# Patient Record
Sex: Female | Born: 1970 | Hispanic: Yes | Marital: Married | Smoking: Never smoker
Health system: Southern US, Community
[De-identification: ages and names within clinical notes are randomized; demographics above are authoritative.]

## PROBLEM LIST (undated history)

## (undated) HISTORY — PX: TUBAL LIGATION: SHX77

---

## 2017-03-13 ENCOUNTER — Ambulatory Visit
Admission: EM | Admit: 2017-03-13 | Discharge: 2017-03-13 | Disposition: A | Discharge status: Home / Self Care | Payer: Self-pay | Attending: Family Medicine | Admitting: Family Medicine

## 2017-03-13 ENCOUNTER — Encounter: Payer: Self-pay | Admitting: Emergency Medicine

## 2017-03-13 DIAGNOSIS — J111 Influenza due to unidentified influenza virus with other respiratory manifestations: Secondary | ICD-10-CM

## 2017-03-13 DIAGNOSIS — R69 Illness, unspecified: Secondary | ICD-10-CM

## 2017-03-13 DIAGNOSIS — R0981 Nasal congestion: Secondary | ICD-10-CM

## 2017-03-13 DIAGNOSIS — R5383 Other fatigue: Secondary | ICD-10-CM

## 2017-03-13 DIAGNOSIS — R05 Cough: Secondary | ICD-10-CM

## 2017-03-13 MED ORDER — OSELTAMIVIR PHOSPHATE 75 MG PO CAPS: 75.0000 mg | ORAL_CAPSULE | Freq: Two times a day (BID) | ORAL | 0 refills | Status: DC

## 2017-03-13 NOTE — ED Provider Notes (Signed)
[facility]    CSN: [id number] Arrival date & time: 03/13/17  0847     History   Chief Complaint Chief Complaint  Patient presents with  . Influenza    HPI Kristin David is a 46 y.o. female.   The history is provided by the patient.  Influenza  Presenting symptoms: cough, fatigue, fever, myalgias and rhinorrhea   Associated symptoms: nasal congestion   URI  Presenting symptoms: congestion, cough, fatigue, fever and rhinorrhea   Severity:  Moderate Onset quality:  Sudden Duration:  3 days Timing:  Constant Progression:  Worsening Chronicity:  New Relieved by:  None tried Ineffective treatments:  None tried Associated symptoms: myalgias   Associated symptoms: no sinus pain and no wheezing   Risk factors: sick contacts   Risk factors: not elderly, no chronic cardiac disease, no chronic kidney disease, no chronic respiratory disease, no diabetes mellitus, no immunosuppression, no recent illness and no recent travel     History reviewed. No pertinent past medical history.  There are no active problems to display for this patient.   Past Surgical History:  Procedure Laterality Date  . TUBAL LIGATION      OB History    No data available       Home Medications    Prior to Admission medications   Medication Sig Start Date End Date Taking? Authorizing Provider  oseltamivir (TAMIFLU) 75 MG capsule Take 1 capsule (75 mg total) by mouth 2 (two) times daily. 03/13/17   Payton Mccallum, MD    Family History Family History  Problem Relation Age of Onset  . Diabetes Mother   . Heart attack Mother   . Heart attack Father     Social History Social History  Substance Use Topics  . Smoking status: Never Smoker  . Smokeless tobacco: Never Used  . Alcohol use No     Allergies   Patient has no known allergies.   Review of Systems Review of Systems  Constitutional: Positive for fatigue and fever.  HENT: Positive for congestion and  rhinorrhea. Negative for sinus pain.   Respiratory: Positive for cough. Negative for wheezing.   Musculoskeletal: Positive for myalgias.     Physical Exam Triage Vital Signs ED Triage Vitals  Enc Vitals Group     BP 03/13/17 0902 111/69     Pulse Rate 03/13/17 0902 81     Resp 03/13/17 0902 18     Temp 03/13/17 0902 98.1 F (36.7 C)     Temp Source 03/13/17 0902 Oral     SpO2 03/13/17 0902 98 %     Weight 03/13/17 0859 166 lb (75.3 kg)     Height 03/13/17 0859  (1.6 m)     Head Circumference --      Peak Flow --      Pain Score 03/13/17 0859 7     Pain Loc --      Pain Edu? --      Excl. in GC? --    No data found.   Updated Vital Signs BP 111/69 (BP Location: Left Arm)   Pulse 81   Temp 98.1 F (36.7 C) (Oral)   Resp 18   Ht  (1.6 m)   Wt 166 lb (75.3 kg)   LMP 03/13/2017   SpO2 98%   BMI 29.41 kg/m   Visual Acuity Right Eye Distance:   Left Eye Distance:   Bilateral Distance:    Right Eye Near:  Left Eye Near:    Bilateral Near:     Physical Exam  Constitutional: She appears well-developed and well-nourished. No distress.  HENT:  Head: Normocephalic and atraumatic.  Right Ear: Tympanic membrane, external ear and ear canal normal.  Left Ear: Tympanic membrane, external ear and ear canal normal.  Nose: No mucosal edema, rhinorrhea, nose lacerations, sinus tenderness, nasal deformity, septal deviation or nasal septal hematoma. No epistaxis.  No foreign bodies. Right sinus exhibits no maxillary sinus tenderness and no frontal sinus tenderness. Left sinus exhibits no maxillary sinus tenderness and no frontal sinus tenderness.  Mouth/Throat: Uvula is midline, oropharynx is clear and moist and mucous membranes are normal. No oropharyngeal exudate.  Eyes: Conjunctivae and EOM are normal. Pupils are equal, round, and reactive to light. Right eye exhibits no discharge. Left eye exhibits no discharge. No scleral icterus.  Neck: Normal range of motion.  Neck supple. No thyromegaly present.  Cardiovascular: Normal rate, regular rhythm and normal heart sounds.   Pulmonary/Chest: Effort normal and breath sounds normal. No respiratory distress. She has no wheezes. She has no rales.  Lymphadenopathy:    She has no cervical adenopathy.  Skin: She is not diaphoretic.  Nursing note and vitals reviewed.    UC Treatments / Results  Labs (all labs ordered are listed, but only abnormal results are displayed) Labs Reviewed - No data to display  EKG  EKG Interpretation None       Radiology No results found.  Procedures Procedures (including critical care time)  Medications Ordered in UC Medications - No data to display   Initial Impression / Assessment and Plan / UC Course  I have reviewed the triage vital signs and the nursing notes.  Pertinent labs & imaging results that were available during my care of the patient were reviewed by me and considered in my medical decision making (see chart for details).      Final Clinical Impressions(s) / UC Diagnoses   Final diagnoses:  Influenza-like illness    New Prescriptions Discharge Medication List as of 03/13/2017  9:27 AM    START taking these medications   Details  oseltamivir (TAMIFLU) 75 MG capsule Take 1 capsule (75 mg total) by mouth 2 (two) times daily., Starting Sun 03/13/2017, Normal       1. diagnosis reviewed with patient 2. rx as per orders above; reviewed possible side effects, interactions, risks and benefits  3. Recommend supportive treatment with rest, fluids 4. Follow-up prn if symptoms worsen or don't improve   Payton Mccallum, MD 03/13/17 1018

## 2017-03-13 NOTE — ED Triage Notes (Signed)
Patient states she developed cough body aches and fever on Thursday evening.

## 2017-03-18 ENCOUNTER — Ambulatory Visit
Admission: EM | Admit: 2017-03-18 | Discharge: 2017-03-18 | Disposition: A | Discharge status: Home / Self Care | Payer: BLUE CROSS/BLUE SHIELD | Attending: Emergency Medicine | Admitting: Emergency Medicine

## 2017-03-18 ENCOUNTER — Encounter: Payer: Self-pay | Admitting: Emergency Medicine

## 2017-03-18 DIAGNOSIS — R05 Cough: Secondary | ICD-10-CM

## 2017-03-18 DIAGNOSIS — J069 Acute upper respiratory infection, unspecified: Secondary | ICD-10-CM

## 2017-03-18 MED ORDER — BENZONATATE 200 MG PO CAPS: ORAL_CAPSULE | ORAL | 0 refills | Status: DC

## 2017-03-18 MED ORDER — GUAIFENESIN-CODEINE 100-10 MG/5ML PO SYRP: 5.0000 mL | ORAL_SOLUTION | Freq: Three times a day (TID) | ORAL | 0 refills | Status: DC | PRN

## 2017-03-18 MED ORDER — FLUTICASONE PROPIONATE 50 MCG/ACT NA SUSP: 2.0000 | Freq: Every day | NASAL | 0 refills | Status: AC

## 2017-03-18 NOTE — ED Provider Notes (Signed)
CSN: [id number]     Arrival date & time 03/18/17  1425 History   None    Chief Complaint  Patient presents with  . Fever  . Cough   (Consider location/radiation/quality/duration/timing/severity/associated sxs/prior Treatment) HPI  46 year old female who was seen 5 days ago here at [facility] urgent care. With a flu like illness. States that she finished her course of Tamiflu but does not feel like she is improved and has continually coughed since the onset. Addition she's having discharge from her nose. She is not work last week because of her illness. Is not any fever or chills. The cough is productive of yellow phlegm.        History reviewed. No pertinent past medical history. Past Surgical History:  Procedure Laterality Date  . TUBAL LIGATION     Family History  Problem Relation Age of Onset  . Diabetes Mother   . Heart attack Mother   . Heart attack Father    Social History  Substance Use Topics  . Smoking status: Never Smoker  . Smokeless tobacco: Never Used  . Alcohol use No   OB History    No data available     Review of Systems  Constitutional: Positive for activity change and chills. Negative for fatigue and fever.  HENT: Positive for congestion, postnasal drip, rhinorrhea, sinus pain and sinus pressure.   Respiratory: Positive for cough. Negative for shortness of breath, wheezing and stridor.   All other systems reviewed and are negative.   Allergies  Patient has no known allergies.  Home Medications   Prior to Admission medications   Medication Sig Start Date End Date Taking? Authorizing Provider  benzonatate (TESSALON) 200 MG capsule Take one cap TID PRN cough 03/18/17   Lutricia Feil, PA-C  fluticasone [facility]) 50 MCG/ACT nasal spray Place 2 sprays into both nostrils daily. 03/18/17   Lutricia Feil, PA-C  guaiFENesin-codeine (CHERATUSSIN AC) 100-10 MG/5ML syrup Take 5 mLs by mouth 3 (three) times daily as needed for cough. 03/18/17   Lutricia Feil, PA-C   Meds Ordered and Administered this Visit  Medications - No data to display  BP 125/83 (BP Location: Left Arm)   Pulse (!) 53   Temp 98.2 F (36.8 C) (Oral)   Resp 16   Ht  (1.6 m)   Wt 166 lb (75.3 kg)   LMP 03/13/2017   SpO2 100%   BMI 29.41 kg/m  No data found.   Physical Exam  Constitutional: She is oriented to person, place, and time. She appears well-developed and well-nourished. No distress.  HENT:  Head: Normocephalic and atraumatic.  Right Ear: External ear normal.  Left Ear: External ear normal.  Nose: Nose normal.  Mouth/Throat: Oropharynx is clear and moist. No oropharyngeal exudate.  Eyes: Pupils are equal, round, and reactive to light. Right eye exhibits no discharge. Left eye exhibits no discharge.  Neck: Normal range of motion.  Pulmonary/Chest: Effort normal and breath sounds normal.  Musculoskeletal: Normal range of motion.  Lymphadenopathy:    She has no cervical adenopathy.  Neurological: She is alert and oriented to person, place, and time.  Skin: Skin is warm and dry. She is not diaphoretic.  Psychiatric: She has a normal mood and affect. Her behavior is normal. Judgment and thought content normal.  Nursing note and vitals reviewed.   Urgent Care Course     Procedures (including critical care time)  Labs Review Labs Reviewed - No data to display  Imaging  Review No results found.   Visual Acuity Review  Right Eye Distance:   Left Eye Distance:   Bilateral Distance:    Right Eye Near:   Left Eye Near:    Bilateral Near:         MDM   1. Upper respiratory tract infection, unspecified type    Discharge Medication List as of 03/18/2017  3:34 PM    START taking these medications   Details  benzonatate (TESSALON) 200 MG capsule Take one cap TID PRN cough, Normal    fluticasone (FLONASE) 50 MCG/ACT nasal spray Place 2 sprays into both nostrils daily., Starting Fri 03/18/2017, Normal    guaiFENesin-codeine  (CHERATUSSIN AC) 100-10 MG/5ML syrup Take 5 mLs by mouth 3 (three) times daily as needed for cough., Starting Fri 03/18/2017, Print      Plan: 1. Test/x-ray results and diagnosis reviewed with patient 2. rx as per orders; risks, benefits, potential side effects reviewed with patient 3. Recommend supportive treatment with Fluids and rest. Tylenol or Motrin for body aches or fever. If she is not improving she should follow-up with her primary care physician 4. F/u prn if symptoms worsen or don't improve     Lutricia Feil, PA-C 03/18/17 1541

## 2017-03-18 NOTE — ED Triage Notes (Signed)
Patient c/o ongoing cough, congestion and fever since Sunday.

## 2017-09-19 ENCOUNTER — Telehealth: Payer: Self-pay

## 2017-09-19 ENCOUNTER — Encounter: Payer: Self-pay | Admitting: *Deleted

## 2017-09-19 ENCOUNTER — Ambulatory Visit
Admission: EM | Admit: 2017-09-19 | Discharge: 2017-09-19 | Disposition: A | Discharge status: Home / Self Care | Payer: BLUE CROSS/BLUE SHIELD | Attending: Family Medicine | Admitting: Family Medicine

## 2017-09-19 DIAGNOSIS — R110 Nausea: Secondary | ICD-10-CM

## 2017-09-19 DIAGNOSIS — R51 Headache: Secondary | ICD-10-CM

## 2017-09-19 DIAGNOSIS — R0981 Nasal congestion: Secondary | ICD-10-CM

## 2017-09-19 DIAGNOSIS — B349 Viral infection, unspecified: Secondary | ICD-10-CM | POA: Diagnosis not present

## 2017-09-19 MED ORDER — ONDANSETRON HCL 4 MG PO TABS: 4.0000 mg | ORAL_TABLET | Freq: Three times a day (TID) | ORAL | 0 refills | Status: DC | PRN

## 2017-09-19 NOTE — ED Provider Notes (Signed)
[facility]    CSN: [id number] Arrival date & time: 09/19/17  1115  History   Chief Complaint Chief Complaint  Patient presents with  . Nausea  . Generalized Body Aches  . Headache  . Nasal Congestion   HPI  46 year old female presents with the above complaints. Patient states she's been sick for the past 4 days. She had a coworker with similar symptoms. She's had body aches, headaches, nausea. She had some sneezing & runny nose this morning. No documented fever. No other associated symptoms. No medications or interventions tried. No known exacerbating or relieving factors. No other associated symptoms.  History reviewed. No pertinent past medical history.  Past Surgical History:  Procedure Laterality Date  . TUBAL LIGATION      OB History    No data available     Home Medications    Prior to Admission medications   Medication Sig Start Date End Date Taking? Authorizing Provider  benzonatate (TESSALON) 200 MG capsule Take one cap TID PRN cough 03/18/17   Ovid Curd P, PA-C  fluticasone [facility]) 50 MCG/ACT nasal spray Place 2 sprays into both nostrils daily. 03/18/17   Lutricia Feil, PA-C  guaiFENesin-codeine (CHERATUSSIN AC) 100-10 MG/5ML syrup Take 5 mLs by mouth 3 (three) times daily as needed for cough. 03/18/17   Lutricia Feil, PA-C  ondansetron (ZOFRAN) 4 MG tablet Take 1 tablet (4 mg total) by mouth every 8 (eight) hours as needed for nausea or vomiting. 09/19/17   Tommie Sams, DO    Family History Family History  Problem Relation Age of Onset  . Diabetes Mother   . Heart attack Mother   . Heart attack Father     Social History Social History  Substance Use Topics  . Smoking status: Never Smoker  . Smokeless tobacco: Never Used  . Alcohol use No     Allergies   Patient has no known allergies.   Review of Systems Review of Systems  Constitutional: Negative for fever.  HENT: Positive for rhinorrhea and sneezing.     Gastrointestinal: Positive for nausea.  Musculoskeletal:       Body aches.   Neurological: Positive for headaches.   Physical Exam Triage Vital Signs ED Triage Vitals  Enc Vitals Group     BP 09/19/17 1147 139/76     Pulse Rate 09/19/17 1147 (!) 52     Resp 09/19/17 1147 16     Temp 09/19/17 1147 98.1 F (36.7 C)     Temp Source 09/19/17 1147 Oral     SpO2 09/19/17 1147 100 %     Weight 09/19/17 1149 175 lb (79.4 kg)     Height 09/19/17 1149 5' 2.5" (1.588 m)     Head Circumference --      Peak Flow --      Pain Score 09/19/17 1150 0     Pain Loc --      Pain Edu? --      Excl. in GC? --    Updated Vital Signs BP 139/76 (BP Location: Left Arm)   Pulse (!) 52   Temp 98.1 F (36.7 C) (Oral)   Resp 16   Ht 5' 2.5" (1.588 m)   Wt 175 lb (79.4 kg)   LMP 09/11/2017   SpO2 100%   BMI 31.50 kg/m   Physical Exam  Constitutional: She appears well-developed. No distress.  HENT:  Head: Normocephalic and atraumatic.  Mouth/Throat: Oropharynx is clear and moist.  Normal TM's  bilaterally.   Eyes: Conjunctivae are normal.  Cardiovascular: Normal rate and regular rhythm.   Murmur heard. Pulmonary/Chest: Effort normal and breath sounds normal. She has no wheezes. She has no rales.  Abdominal: Soft. She exhibits no distension. There is no tenderness.  Neurological: She is alert.  Vitals reviewed.  UC Treatments / Results  Labs (all labs ordered are listed, but only abnormal results are displayed) Labs Reviewed - No data to display  EKG  EKG Interpretation None       Radiology No results found.  Procedures Procedures (including critical care time)  Medications Ordered in UC Medications - No data to display   Initial Impression / Assessment and Plan / UC Course  I have reviewed the triage vital signs and the nursing notes.  Pertinent labs & imaging results that were available during my care of the patient were reviewed by me and considered in my medical  decision making (see chart for details).    46 year old female presents with a constellation of symptoms consistent with a viral illness. Exam unremarkable. Supportive care. Zofran as needed. Final Clinical Impressions(s) / UC Diagnoses   Final diagnoses:  Viral illness    New Prescriptions New Prescriptions   ONDANSETRON (ZOFRAN) 4 MG TABLET    Take 1 tablet (4 mg total) by mouth every 8 (eight) hours as needed for nausea or vomiting.     Controlled Substance Prescriptions  Controlled Substance Registry consulted? Not Applicable   Tommie Sams, DO 09/19/17 1226

## 2017-09-19 NOTE — ED Triage Notes (Signed)
Patient started having symptoms of body aches, headache, nasal congestion, and nausea 4 days ago. Patient reports co worker with same symptoms.

## 2017-09-19 NOTE — Discharge Instructions (Signed)
This is viral.  Zofran as needed for nausea. OTC medication as needed.  Take care  Dr. Adriana Simas

## 2020-12-30 ENCOUNTER — Ambulatory Visit
Admission: EM | Admit: 2020-12-30 | Discharge: 2020-12-30 | Disposition: A | Discharge status: Home / Self Care | Payer: BLUE CROSS/BLUE SHIELD | Attending: Family Medicine | Admitting: Family Medicine

## 2020-12-30 ENCOUNTER — Encounter: Payer: Self-pay | Admitting: Emergency Medicine

## 2020-12-30 ENCOUNTER — Other Ambulatory Visit: Payer: Self-pay

## 2020-12-30 DIAGNOSIS — R0981 Nasal congestion: Secondary | ICD-10-CM

## 2020-12-30 DIAGNOSIS — R519 Headache, unspecified: Secondary | ICD-10-CM | POA: Diagnosis present

## 2020-12-30 DIAGNOSIS — J029 Acute pharyngitis, unspecified: Secondary | ICD-10-CM

## 2020-12-30 DIAGNOSIS — U071 COVID-19: Secondary | ICD-10-CM | POA: Diagnosis not present

## 2020-12-30 DIAGNOSIS — J069 Acute upper respiratory infection, unspecified: Secondary | ICD-10-CM

## 2020-12-30 NOTE — ED Provider Notes (Signed)
[facility]    CSN: [id number] Arrival date & time: 12/30/20  0956      History   Chief Complaint Chief Complaint  Patient presents with  . Headache  . Nasal Congestion    HPI Kristin David is a 50 y.o. female presenting for 3 day history of sore throat, runny nose, and headaches.  Also admits to mild cough. Taking OTC cough syrups.  Patient says the over-the-counter medications do help.  She denies any other complaints.  Denies any fever, breathing difficulty, vomiting or diarrhea.  Patient denies COVID exposure.She says she had 2 negative home COVID tests. Has been tested for COVID yesterday but does not have results yet. Is fully vaccinated for COVID 19.  Patient does request repeat COVID testing and strep testing.  Language interpreter service used today.  HPI  History reviewed. No pertinent past medical history.  There are no problems to display for this patient.   Past Surgical History:  Procedure Laterality Date  . TUBAL LIGATION      OB History   No obstetric history on file.      Home Medications    Prior to Admission medications   Medication Sig Start Date End Date Taking? Authorizing Provider  benzonatate (TESSALON) 200 MG capsule Take one cap TID PRN cough 03/18/17   Ovid Curd P, PA-C  fluticasone [facility]) 50 MCG/ACT nasal spray Place 2 sprays into both nostrils daily. 03/18/17   Lutricia Feil, PA-C  guaiFENesin-codeine (CHERATUSSIN AC) 100-10 MG/5ML syrup Take 5 mLs by mouth 3 (three) times daily as needed for cough. 03/18/17   Lutricia Feil, PA-C  ondansetron (ZOFRAN) 4 MG tablet Take 1 tablet (4 mg total) by mouth every 8 (eight) hours as needed for nausea or vomiting. 09/19/17   Tommie Sams, DO    Family History Family History  Problem Relation Age of Onset  . Diabetes Mother   . Heart attack Mother   . Heart attack Father     Social History Social History   Tobacco Use  . Smoking status: Never Smoker   . Smokeless tobacco: Never Used  Substance Use Topics  . Alcohol use: No  . Drug use: No     Allergies   Patient has no known allergies.   Review of Systems Review of Systems  Constitutional: Negative for chills, diaphoresis, fatigue and fever.  HENT: Positive for congestion, rhinorrhea and sore throat. Negative for ear pain, sinus pressure and sinus pain.   Respiratory: Positive for cough. Negative for shortness of breath.   Gastrointestinal: Negative for abdominal pain, nausea and vomiting.  Musculoskeletal: Negative for arthralgias and myalgias.  Skin: Negative for rash.  Neurological: Positive for headaches. Negative for weakness.  Hematological: Negative for adenopathy.     Physical Exam Triage Vital Signs ED Triage Vitals  Enc Vitals Group     BP 12/30/20 1034 133/73     Pulse Rate 12/30/20 1034 72     Resp 12/30/20 1034 18     Temp 12/30/20 1034 99.1 F (37.3 C)     Temp Source 12/30/20 1034 Oral     SpO2 12/30/20 1034 97 %     Weight 12/30/20 1032 175 lb 0.7 oz (79.4 kg)     Height 12/30/20 1032 5' 2.25" (1.581 m)     Head Circumference --      Peak Flow --      Pain Score 12/30/20 1031 8     Pain Loc --  Pain Edu? --      Excl. in GC? --    No data found.  Updated Vital Signs BP 133/73 (BP Location: Left Arm)   Pulse 72   Temp 99.1 F (37.3 C) (Oral)   Resp 18   Ht 5' 2.25" (1.581 m)   Wt 175 lb 0.7 oz (79.4 kg)   SpO2 97%   BMI 31.76 kg/m      Physical Exam Vitals and nursing note reviewed.  Constitutional:      General: She is not in acute distress.    Appearance: Normal appearance. She is not ill-appearing or toxic-appearing.  HENT:     Head: Normocephalic and atraumatic.     Nose: Congestion and rhinorrhea present.     Mouth/Throat:     Mouth: Mucous membranes are moist.     Pharynx: Oropharynx is clear. Posterior oropharyngeal erythema (mild) present.  Eyes:     General: No scleral icterus.       Right eye: No discharge.         Left eye: No discharge.     Conjunctiva/sclera: Conjunctivae normal.  Cardiovascular:     Rate and Rhythm: Normal rate and regular rhythm.     Heart sounds: Normal heart sounds.  Pulmonary:     Effort: Pulmonary effort is normal. No respiratory distress.     Breath sounds: Normal breath sounds.  Musculoskeletal:     Cervical back: Neck supple.  Skin:    General: Skin is dry.  Neurological:     General: No focal deficit present.     Mental Status: She is alert. Mental status is at baseline.     Motor: No weakness.     Gait: Gait normal.  Psychiatric:        Mood and Affect: Mood normal.        Behavior: Behavior normal.        Thought Content: Thought content normal.      UC Treatments / Results  Labs (all labs ordered are listed, but only abnormal results are displayed) Labs Reviewed  CULTURE, GROUP A STREP ([facility])  SARS CORONAVIRUS 2 (TAT 6-24 HRS)    EKG   Radiology No results found.  Procedures Procedures (including critical care time)  Medications Ordered in UC Medications - No data to display  Initial Impression / Assessment and Plan / UC Course  I have reviewed the triage vital signs and the nursing notes.  Pertinent labs & imaging results that were available during my care of the patient were reviewed by me and considered in my medical decision making (see chart for details).   50 year old female presenting for 3-day history of cough, congestion and sore throat.  She has COVID test pending from yesterday, request another test today.  COVID testing obtained.  Strep culture also obtained since we do not have rapid strep testing.  Exam is consistent with viral illness, COVID not ruled out at this time.  Current CDC guidelines, isolation protocol and ED precautions reviewed with patient.  Supportive care with increasing rest and fluids and continuing over-the-counter medications.  Advised to follow-up with her clinic as needed for any new or worsening  symptoms.   Final Clinical Impressions(s) / UC Diagnoses   Final diagnoses:  Upper respiratory tract infection, unspecified type  Nasal congestion  Sore throat     Discharge Instructions     You have received COVID testing today either for positive exposure, concerning symptoms that could be related to COVID infection,  screening purposes, or re-testing after confirmed positive.  Your test obtained today checks for active viral infection in the last 1-2 weeks. If your test is negative now, you can still test positive later. So, if you do develop symptoms you should either get re-tested and/or isolate x 5 days and then strict mask use x 5 days (unvaccinated) or mask use x 10 days (vaccinated). Please follow CDC guidelines.  While Rapid antigen tests come back in 15-20 minutes, send out PCR/molecular test results typically come back within 1-3 days. In the mean time, if you are symptomatic, assume this could be a positive test and treat/monitor yourself as if you do have COVID.   We will call with test results if positive. Please download the MyChart app and set up a profile to access test results.   If symptomatic, go home and rest. Push fluids. Take Tylenol as needed for discomfort. Gargle warm salt water. Throat lozenges. Take Mucinex DM or Robitussin for cough. Humidifier in bedroom to ease coughing. Warm showers. Also review the COVID handout for more information.  COVID-19 INFECTION: The incubation period of COVID-19 is approximately 14 days after exposure, with most symptoms developing in roughly 4-5 days. Symptoms may range in severity from mild to critically severe. Roughly 80% of those infected will have mild symptoms. People of any age may become infected with COVID-19 and have the ability to transmit the virus. The most common symptoms include: fever, fatigue, cough, body aches, headaches, sore throat, nasal congestion, shortness of breath, nausea, vomiting, diarrhea, changes in smell  and/or taste.    COURSE OF ILLNESS Some patients may begin with mild disease which can progress quickly into critical symptoms. If your symptoms are worsening please call ahead to the Emergency Department and proceed there for further treatment. Recovery time appears to be roughly 1-2 weeks for mild symptoms and 3-6 weeks for severe disease.   GO IMMEDIATELY TO ER FOR FEVER YOU ARE UNABLE TO GET DOWN WITH TYLENOL, BREATHING PROBLEMS, CHEST PAIN, FATIGUE, LETHARGY, INABILITY TO EAT OR DRINK, ETC  QUARANTINE AND ISOLATION: To help decrease the spread of COVID-19 please remain isolated if you have COVID infection or are highly suspected to have COVID infection. This means -stay home and isolate to one room in the home if you live with others. Do not share a bed or bathroom with others while ill, sanitize and wipe down all countertops and keep common areas clean and disinfected. Stay home for 5 days. If you have no symptoms or your symptoms are resolving after 5 days, you can leave your house. Continue to wear a mask around others for 5 additional days. If you have been in close contact (within 6 feet) of someone diagnosed with COVID 19, you are advised to quarantine in your home for 14 days as symptoms can develop anywhere from 2-14 days after exposure to the virus. If you develop symptoms, you  must isolate.  Most current guidelines for COVID after exposure -unvaccinated: isolate 5 days and strict mask use x 5 days. Test on day 5 is possible -vaccinated: wear mask x 10 days if symptoms do not develop -You do not necessarily need to be tested for COVID if you have + exposure and  develop symptoms. Just isolate at home x10 days from symptom onset During this global pandemic, CDC advises to practice social distancing, try to stay at least 506ft away from others at all times. Wear a face covering. Wash and sanitize your hands regularly and avoid  going anywhere that is not necessary.  KEEP IN MIND THAT THE  COVID TEST IS NOT 100% ACCURATE AND YOU SHOULD STILL DO EVERYTHING TO PREVENT POTENTIAL SPREAD OF VIRUS TO OTHERS (WEAR MASK, WEAR GLOVES, WASH HANDS AND SANITIZE REGULARLY). IF INITIAL TEST IS NEGATIVE, THIS MAY NOT MEAN YOU ARE DEFINITELY NEGATIVE. MOST ACCURATE TESTING IS DONE 5-7 DAYS AFTER EXPOSURE.   It is not advised by CDC to get re-tested after receiving a positive COVID test since you can still test positive for weeks to months after you have already cleared the virus.   *If you have not been vaccinated for COVID, I strongly suggest you consider getting vaccinated as long as there are no contraindications.      ED Prescriptions    None     PDMP not reviewed this encounter.   Shirlee Latch, PA-C 12/30/20 1231

## 2020-12-30 NOTE — Discharge Instructions (Addendum)

## 2020-12-30 NOTE — ED Triage Notes (Signed)
Pt c/o sore throat, runny nose, and headache started about 3 days ago. Pt states she was swabbed yesterday for covid but is awaiting results.

## 2020-12-31 LAB — SARS CORONAVIRUS 2 (TAT 6-24 HRS): SARS Coronavirus 2: POSITIVE — AB

## 2021-01-01 ENCOUNTER — Other Ambulatory Visit: Payer: Self-pay

## 2021-01-01 ENCOUNTER — Encounter: Payer: Self-pay | Admitting: Emergency Medicine

## 2021-01-01 ENCOUNTER — Emergency Department: Payer: BLUE CROSS/BLUE SHIELD

## 2021-01-01 ENCOUNTER — Emergency Department
Admission: EM | Admit: 2021-01-01 | Discharge: 2021-01-01 | Disposition: A | Discharge status: Home / Self Care | Payer: BLUE CROSS/BLUE SHIELD | Attending: Emergency Medicine | Admitting: Emergency Medicine

## 2021-01-01 DIAGNOSIS — U071 COVID-19: Secondary | ICD-10-CM | POA: Insufficient documentation

## 2021-01-01 DIAGNOSIS — R0789 Other chest pain: Secondary | ICD-10-CM

## 2021-01-01 DIAGNOSIS — R079 Chest pain, unspecified: Secondary | ICD-10-CM | POA: Diagnosis present

## 2021-01-01 LAB — CBC WITH DIFFERENTIAL/PLATELET
Abs Immature Granulocytes: 0.02 10*3/uL (ref 0.00–0.07)
Basophils Absolute: 0 10*3/uL (ref 0.0–0.1)
Basophils Relative: 1 %
Eosinophils Absolute: 0.1 10*3/uL (ref 0.0–0.5)
Eosinophils Relative: 1 %
HCT: 43.8 % (ref 36.0–46.0)
Hemoglobin: 14.7 g/dL (ref 12.0–15.0)
Immature Granulocytes: 1 %
Lymphocytes Relative: 24 %
Lymphs Abs: 1 10*3/uL (ref 0.7–4.0)
MCH: 32.9 pg (ref 26.0–34.0)
MCHC: 33.6 g/dL (ref 30.0–36.0)
MCV: 98 fL (ref 80.0–100.0)
Monocytes Absolute: 0.5 10*3/uL (ref 0.1–1.0)
Monocytes Relative: 12 %
Neutro Abs: 2.7 10*3/uL (ref 1.7–7.7)
Neutrophils Relative %: 61 %
Platelets: 165 10*3/uL (ref 150–400)
RBC: 4.47 MIL/uL (ref 3.87–5.11)
RDW: 12.2 % (ref 11.5–15.5)
WBC: 4.3 10*3/uL (ref 4.0–10.5)
nRBC: 0 % (ref 0.0–0.2)

## 2021-01-01 LAB — COMPREHENSIVE METABOLIC PANEL
ALT: 41 U/L (ref 0–44)
AST: 32 U/L (ref 15–41)
Albumin: 4.6 g/dL (ref 3.5–5.0)
Alkaline Phosphatase: 90 U/L (ref 38–126)
Anion gap: 11 (ref 5–15)
BUN: 11 mg/dL (ref 6–20)
CO2: 26 mmol/L (ref 22–32)
Calcium: 9.3 mg/dL (ref 8.9–10.3)
Chloride: 105 mmol/L (ref 98–111)
Creatinine, Ser: 0.62 mg/dL (ref 0.44–1.00)
GFR, Estimated: >60 mL/min (ref >60)
Glucose, Bld: 110 mg/dL — ABNORMAL HIGH (ref 70–99)
Potassium: 4 mmol/L (ref 3.5–5.1)
Sodium: 142 mmol/L (ref 135–145)
Total Bilirubin: 0.7 mg/dL (ref 0.3–1.2)
Total Protein: 8.2 g/dL — ABNORMAL HIGH (ref 6.5–8.1)

## 2021-01-01 LAB — CULTURE, GROUP A STREP (THRC)

## 2021-01-01 LAB — TROPONIN I (HIGH SENSITIVITY): Troponin I (High Sensitivity): 14 ng/L (ref <18)

## 2021-01-01 MED ORDER — PREDNISONE 10 MG (21) PO TBPK: ORAL_TABLET | ORAL | 0 refills | Status: DC

## 2021-01-01 NOTE — ED Notes (Signed)
Pt ambulatory to room with NAD. Pt COVID +. Pt stating increased back/neck/CP pain and HA. Marland Kitchen

## 2021-01-01 NOTE — ED Triage Notes (Signed)
Tested positive for COVID yesterday. Today arrives with c/o back, neck, head aches.  Also left side chest pain.   AAOx3.  Skin warm and dry. NAD

## 2021-01-01 NOTE — ED Provider Notes (Signed)
[facility] Emergency Department Provider Note  ____________________________________________   Event Date/Time   First MD Initiated Contact with Patient 01/01/21 1158     (approximate)  I have reviewed the triage vital signs and the nursing notes.   HISTORY  Chief Complaint Generalized Body Aches    HPI Kristin David Kristin David is a 50 y.o. female presents emergency department complaint of left-sided chest pain along with being COVID-positive for 6 days.  Patient states that she is not short of breath but keeps having sharp pains in the left side of her chest that radiates through to her back.  Patient is not vaccinated for COVID.  She tested positive yesterday the symptoms had started earlier    History reviewed. No pertinent past medical history.  There are no problems to display for this patient.   Past Surgical History:  Procedure Laterality Date  . TUBAL LIGATION      Prior to Admission medications   Medication Sig Start Date End Date Taking? Authorizing Provider  predniSONE (STERAPRED UNI-PAK 21 TAB) 10 MG (21) TBPK tablet Take 6 pills on day one then decrease by 1 pill each day 01/01/21  Yes Fisher, Roselyn Bering, PA-C  benzonatate (TESSALON) 200 MG capsule Take one cap TID PRN cough 03/18/17   Lutricia Feil, PA-C  fluticasone (FLONASE) 50 MCG/ACT nasal spray Place 2 sprays into both nostrils daily. 03/18/17   Lutricia Feil, PA-C  guaiFENesin-codeine (CHERATUSSIN AC) 100-10 MG/5ML syrup Take 5 mLs by mouth 3 (three) times daily as needed for cough. 03/18/17   Lutricia Feil, PA-C  ondansetron (ZOFRAN) 4 MG tablet Take 1 tablet (4 mg total) by mouth every 8 (eight) hours as needed for nausea or vomiting. 09/19/17   Tommie Sams, DO    Allergies Patient has no known allergies.  Family History  Problem Relation Age of Onset  . Diabetes Mother   . Heart attack Mother   . Heart attack Father     Social History Social History   Tobacco  Use  . Smoking status: Never Smoker  . Smokeless tobacco: Never Used  Substance Use Topics  . Alcohol use: No  . Drug use: No    Review of Systems  Constitutional: Positive fever/chills Eyes: No visual changes. ENT: No sore throat. Respiratory: Denies cough Cardiovascular: Positive chest pain Gastrointestinal: Denies abdominal pain Genitourinary: Negative for dysuria. Musculoskeletal: Negative for back pain. Skin: Negative for rash. Psychiatric: no mood changes,     ____________________________________________   PHYSICAL EXAM:  VITAL SIGNS: ED Triage Vitals  Enc Vitals Group     BP 01/01/21 0941 (!) 154/88     Pulse Rate 01/01/21 0941 66     Resp 01/01/21 0941 16     Temp 01/01/21 0941 99 F (37.2 C)     Temp Source 01/01/21 0941 Oral     SpO2 01/01/21 0941 96 %     Weight 01/01/21 0943 173 lb (78.5 kg)     Height 01/01/21 0943 5\' 1"  (1.549 m)     Head Circumference --      Peak Flow --      Pain Score 01/01/21 0946 7     Pain Loc --      Pain Edu? --      Excl. in GC? --     Constitutional: Alert and oriented. Well appearing and in no acute distress. Eyes: Conjunctivae are normal.  Head: Atraumatic. Nose: No congestion/rhinnorhea. Mouth/Throat: Mucous membranes are moist.  Neck:  supple no lymphadenopathy noted Cardiovascular: Normal rate, regular rhythm. Heart sounds are normal Respiratory: Normal respiratory effort.  No retractions, lungs c t a  GU: deferred Musculoskeletal: FROM all extremities, warm and well perfused Neurologic:  Normal speech and language.  Skin:  Skin is warm, dry and intact. No rash noted. Psychiatric: Mood and affect are normal. Speech and behavior are normal.  ____________________________________________   LABS (all labs ordered are listed, but only abnormal results are displayed)  Labs Reviewed  COMPREHENSIVE METABOLIC PANEL - Abnormal; Notable for the following components:      Result Value   Glucose, Bld 110 (*)     Total Protein 8.2 (*)    All other components within normal limits  CBC WITH DIFFERENTIAL/PLATELET  TROPONIN I (HIGH SENSITIVITY)  TROPONIN I (HIGH SENSITIVITY)   ____________________________________________   ____________________________________________  RADIOLOGY  Chest x-ray  ____________________________________________   PROCEDURES  Procedure(s) performed: EKG see physician read  Procedures    ____________________________________________   INITIAL IMPRESSION / ASSESSMENT AND PLAN / ED COURSE  Pertinent labs & imaging results that were available during my care of the patient were reviewed by me and considered in my medical decision making (see chart for details).   Is a COVID-positive female complaining of left-sided chest pain.  See HPI.  Physical exam is unremarkable.  Patient is not short of breath.  DDx: COVID, COVID-pneumonia, MI, nonspecific chest pain  CBC, metabolic panel, troponin  Chest x-ray is normal   labs are normal and reassuring, did explain everything in great detail via the video interpreter, pt was given a rx for prednisone, strict instructions to return if worsening, discharged in stable condition  Kristin David Kristin David was evaluated in Emergency Department on 01/01/2021 for the symptoms described in the history of present illness. She was evaluated in the context of the global COVID-19 pandemic, which necessitated consideration that the patient might be at risk for infection with the SARS-CoV-2 virus that causes COVID-19. Institutional protocols and algorithms that pertain to the evaluation of patients at risk for COVID-19 are in a state of rapid change based on information released by regulatory bodies including the CDC and federal and state organizations. These policies and algorithms were followed during the patient's care in the ED.    As part of my medical decision making, I reviewed the following data within the electronic medical  record:  Nursing notes reviewed and incorporated, Labs reviewed , EKG interpreted NSR, see physician read, Old chart reviewed, Radiograph reviewed , Notes from prior ED visits and [place] Controlled Substance Database  ____________________________________________   FINAL CLINICAL IMPRESSION(S) / ED DIAGNOSES  Final diagnoses:  COVID-19  Chest wall pain      NEW MEDICATIONS STARTED DURING THIS VISIT:  Discharge Medication List as of 01/01/2021  1:22 PM    START taking these medications   Details  predniSONE (STERAPRED UNI-PAK 21 TAB) 10 MG (21) TBPK tablet Take 6 pills on day one then decrease by 1 pill each day, Print         Note:  This document was prepared using Dragon voice recognition software and may include unintentional dictation errors.    Faythe Ghee, PA-C 01/01/21 1407    Merwyn Katos, MD 01/01/21 [phone]

## 2021-01-01 NOTE — Discharge Instructions (Addendum)

## 2021-01-02 LAB — CULTURE, GROUP A STREP (THRC)

## 2021-01-07 ENCOUNTER — Other Ambulatory Visit: Payer: Self-pay

## 2021-01-07 ENCOUNTER — Encounter: Payer: Self-pay | Admitting: Emergency Medicine

## 2021-01-07 ENCOUNTER — Ambulatory Visit
Admission: EM | Admit: 2021-01-07 | Discharge: 2021-01-07 | Disposition: A | Discharge status: Home / Self Care | Payer: BLUE CROSS/BLUE SHIELD | Attending: Sports Medicine | Admitting: Sports Medicine

## 2021-01-07 DIAGNOSIS — R059 Cough, unspecified: Secondary | ICD-10-CM | POA: Diagnosis not present

## 2021-01-07 DIAGNOSIS — G933 Postviral fatigue syndrome: Secondary | ICD-10-CM

## 2021-01-07 DIAGNOSIS — U071 COVID-19: Secondary | ICD-10-CM | POA: Diagnosis not present

## 2021-01-07 DIAGNOSIS — R5081 Fever presenting with conditions classified elsewhere: Secondary | ICD-10-CM | POA: Diagnosis not present

## 2021-01-07 DIAGNOSIS — G9331 Postviral fatigue syndrome: Secondary | ICD-10-CM

## 2021-01-07 MED ORDER — BENZONATATE 200 MG PO CAPS: ORAL_CAPSULE | ORAL | 0 refills | Status: DC

## 2021-01-07 NOTE — Discharge Instructions (Addendum)
Please see attached instructions.

## 2021-01-07 NOTE — ED Triage Notes (Signed)
Patient was COVID positive on 01/18. She states she continues to have fever and headache. She has been taking Prednisone from her ER visit and she has felt better from that but continues with fevers.

## 2021-01-07 NOTE — ED Provider Notes (Signed)
[facility]    CSN: [id number] Arrival date & time: 01/07/21  2536      History   Chief Complaint Chief Complaint  Patient presents with  . Headache  . Fever    HPI Kristin David Regner is a 50 y.o. female.   Patient is a pleasant 50 year old Hispanic female who presents for evaluation of the above issues.  Patient reports having Covid-like symptoms now for about 2 weeks. She was seen here on January 18 and had a positive Covid test. She has isolated and finished her quarantine. She had persistent symptoms that necessitated an ER visit on 20 January as she was having some discomfort in the left side of her chest. She was given prednisone. Those symptoms have resolved. She is concerned because she continues to have fever, headache, weakness, sweating, and cough. She also has decreased appetite. She has not been vaccinated against COVID-19 or influenza. She denies chest pain shortness of breath. No abdominal symptoms, no nausea vomiting diarrhea. No red flag signs or symptoms elicited on history.     History reviewed. No pertinent past medical history.  There are no problems to display for this patient.   Past Surgical History:  Procedure Laterality Date  . TUBAL LIGATION      OB History   No obstetric history on file.      Home Medications    Prior to Admission medications   Medication Sig Start Date End Date Taking? Authorizing Provider  ondansetron (ZOFRAN) 4 MG tablet Take 1 tablet (4 mg total) by mouth every 8 (eight) hours as needed for nausea or vomiting. 09/19/17  Yes Cook, Jayce G, DO  predniSONE (STERAPRED UNI-PAK 21 TAB) 10 MG (21) TBPK tablet Take 6 pills on day one then decrease by 1 pill each day 01/01/21  Yes Fisher, Roselyn Bering, PA-C  benzonatate (TESSALON) 200 MG capsule Take one cap TID PRN cough 01/07/21   Delton See, MD  fluticasone [facility]) 50 MCG/ACT nasal spray Place 2 sprays into both nostrils daily. 03/18/17   Lutricia Feil,  PA-C  guaiFENesin-codeine (CHERATUSSIN AC) 100-10 MG/5ML syrup Take 5 mLs by mouth 3 (three) times daily as needed for cough. 03/18/17   Lutricia Feil, PA-C    Family History Family History  Problem Relation Age of Onset  . Diabetes Mother   . Heart attack Mother   . Heart attack Father     Social History Social History   Tobacco Use  . Smoking status: Never Smoker  . Smokeless tobacco: Never Used  Substance Use Topics  . Alcohol use: No  . Drug use: No     Allergies   Patient has no known allergies.   Review of Systems Review of Systems  Constitutional: Positive for diaphoresis, fatigue and fever. Negative for activity change and appetite change.  HENT: Negative for congestion, ear pain, postnasal drip, rhinorrhea, sinus pressure, sinus pain, sneezing and sore throat.   Respiratory: Positive for cough. Negative for apnea, choking, chest tightness, shortness of breath, wheezing and stridor.   Cardiovascular: Negative for chest pain and palpitations.  Gastrointestinal: Negative for abdominal pain, constipation, diarrhea, nausea and vomiting.  Genitourinary: Negative for dysuria and hematuria.  Musculoskeletal: Negative for back pain, myalgias and neck pain.  Neurological: Positive for headaches.  All other systems reviewed and are negative.    Physical Exam Triage Vital Signs ED Triage Vitals  Enc Vitals Group     BP 01/07/21 1020 129/75     Pulse  Rate 01/07/21 1020 77     Resp 01/07/21 1020 18     Temp 01/07/21 1020 99.2 F (37.3 C)     Temp Source 01/07/21 1020 Oral     SpO2 01/07/21 1020 97 %     Weight 01/07/21 1020 173 lb 1 oz (78.5 kg)     Height 01/07/21 1020 5\' 1"  (1.549 m)     Head Circumference --      Peak Flow --      Pain Score 01/07/21 1018 8     Pain Loc --      Pain Edu? --      Excl. in GC? --    No data found.  Updated Vital Signs BP 129/75 (BP Location: Right Arm)   Pulse 77   Temp 99.2 F (37.3 C) (Oral)   Resp 18   Ht 5\' 1"   (1.549 m)   Wt 78.5 kg   SpO2 97%   BMI 32.70 kg/m   Visual Acuity Right Eye Distance:   Left Eye Distance:   Bilateral Distance:    Right Eye Near:   Left Eye Near:    Bilateral Near:     Physical Exam Vitals and nursing note reviewed.  Constitutional:      General: She is not in acute distress.    Appearance: She is well-developed. She is not ill-appearing, toxic-appearing or diaphoretic.  HENT:     Head: Normocephalic and atraumatic.     Nose: Nose normal.     Mouth/Throat:     Mouth: Mucous membranes are moist.     Pharynx: Oropharynx is clear.  Eyes:     Extraocular Movements: Extraocular movements intact.     Right eye: Normal extraocular motion and no nystagmus.     Left eye: Normal extraocular motion and no nystagmus.     Conjunctiva/sclera: Conjunctivae normal.     Pupils: Pupils are equal, round, and reactive to light.  Cardiovascular:     Rate and Rhythm: Normal rate and regular rhythm.     Pulses: Normal pulses.     Heart sounds: Normal heart sounds. No murmur heard. No friction rub. No gallop.   Pulmonary:     Effort: Pulmonary effort is normal. No tachypnea, accessory muscle usage, prolonged expiration, respiratory distress or retractions.     Breath sounds: Normal breath sounds. No stridor. No wheezing, rhonchi or rales.     Comments: Does cough throughout auscultation especially with deep breaths. No wheezes rhonchi or rubs. Musculoskeletal:     Cervical back: Normal range of motion and neck supple. No rigidity.  Lymphadenopathy:     Cervical: Cervical adenopathy present.  Skin:    General: Skin is warm and dry.     Capillary Refill: Capillary refill takes less than 2 seconds.  Neurological:     General: No focal deficit present.     Mental Status: She is alert and oriented to person, place, and time.  Psychiatric:        Mood and Affect: Mood normal.        Behavior: Behavior normal.      UC Treatments / Results  Labs (all labs ordered are  listed, but only abnormal results are displayed) Labs Reviewed - No data to display  EKG   Radiology No results found.  Procedures Procedures (including critical care time)  Medications Ordered in UC Medications - No data to display  Initial Impression / Assessment and Plan / UC Course  I have reviewed the  triage vital signs and the nursing notes.  Pertinent labs & imaging results that were available during my care of the patient were reviewed by me and considered in my medical decision making (see chart for details).   Clinical impression: Persistent fevers headaches weakness diaphoresis cough and decreased appetite. Patient tested positive for Covid on 18 January and was seen in the ER on 20 January. Please see history above. Physical exam is reassuring. Patient just has cough with auscultation but otherwise is within normal limits. Her vital signs are also within normal limits.  Treatment plan: 1. The findings and treatment plan were discussed in detail with the patient. Patient was in agreement. 2. I will get a go ahead and give her some Tessalon Perles for her cough. Was sent into her pharmacy. 3. Educational handouts were provided on Covid and some of her symptoms. I had a long discussion with her regarding the fact that her symptoms can persist for quite a while. She has completed the quarantine but she should be careful around friends and family. Her fever and fatigue can persist and she just needs to monitor that. Red flag signs and symptoms were discussed in detail and when to seek out immediate medical attention. She voiced verbal understanding. 4. Over-the-counter meds, Tylenol or Motrin for fever discomfort, plenty of fluids and plenty of rest. 5. Follow-up here as needed.    Final Clinical Impressions(s) / UC Diagnoses   Final diagnoses:  COVID-19  Cough  Fever in other diseases  Postviral fatigue syndrome     Discharge Instructions     Please see attached  instructions.    ED Prescriptions    Medication Sig Dispense Auth. Provider   benzonatate (TESSALON) 200 MG capsule Take one cap TID PRN cough 30 capsule Delton See, MD     PDMP not reviewed this encounter.   Delton See, MD 01/07/21 1145

## 2022-05-14 ENCOUNTER — Ambulatory Visit
Admission: RE | Admit: 2022-05-14 | Discharge: 2022-05-14 | Disposition: A | Discharge status: Home / Self Care | Source: Ambulatory Visit | Attending: Physician Assistant | Admitting: Physician Assistant

## 2022-05-14 ENCOUNTER — Ambulatory Visit
Admission: RE | Admit: 2022-05-14 | Discharge: 2022-05-14 | Disposition: A | Discharge status: Home / Self Care | Attending: Physician Assistant | Admitting: Physician Assistant

## 2022-05-14 ENCOUNTER — Other Ambulatory Visit: Payer: Self-pay | Admitting: Physician Assistant

## 2022-05-14 DIAGNOSIS — R52 Pain, unspecified: Secondary | ICD-10-CM

## 2022-05-17 ENCOUNTER — Other Ambulatory Visit: Payer: Self-pay

## 2022-05-17 MED ORDER — TRAMADOL HCL 50 MG PO TABS
ORAL_TABLET | ORAL | 0 refills | Status: AC
  Filled 2022-05-17: qty 20, 3d supply, fill #0

## 2022-05-17 MED ORDER — PREDNISONE 20 MG PO TABS
ORAL_TABLET | ORAL | 0 refills | Status: DC
  Filled 2022-05-17: qty 12, 6d supply, fill #0

## 2022-05-24 ENCOUNTER — Other Ambulatory Visit: Payer: Self-pay

## 2022-05-24 MED ORDER — MELOXICAM 15 MG PO TABS
ORAL_TABLET | ORAL | 0 refills | Status: DC
Start: 2022-05-24 — End: 2022-05-31
  Filled 2022-05-24: qty 7, 7d supply, fill #0

## 2022-05-31 ENCOUNTER — Other Ambulatory Visit: Payer: Self-pay

## 2022-05-31 MED ORDER — MELOXICAM 15 MG PO TABS
ORAL_TABLET | ORAL | 0 refills | Status: DC
  Filled 2022-05-31: qty 7, 7d supply, fill #0

## 2022-07-27 IMAGING — CR DG SHOULDER 2+V*R*
1 series · 3 of 3 positions shown · non-contrast
Comparison: None Available.

CLINICAL DATA: Slipped and fell at work yesterday.  Pain.

EXAM:
RIGHT SHOULDER - 2+ VIEW

[Series 1: dg shoulder right · 0.14mm/px · 3 of 3 slices shown]
[im 1/3]
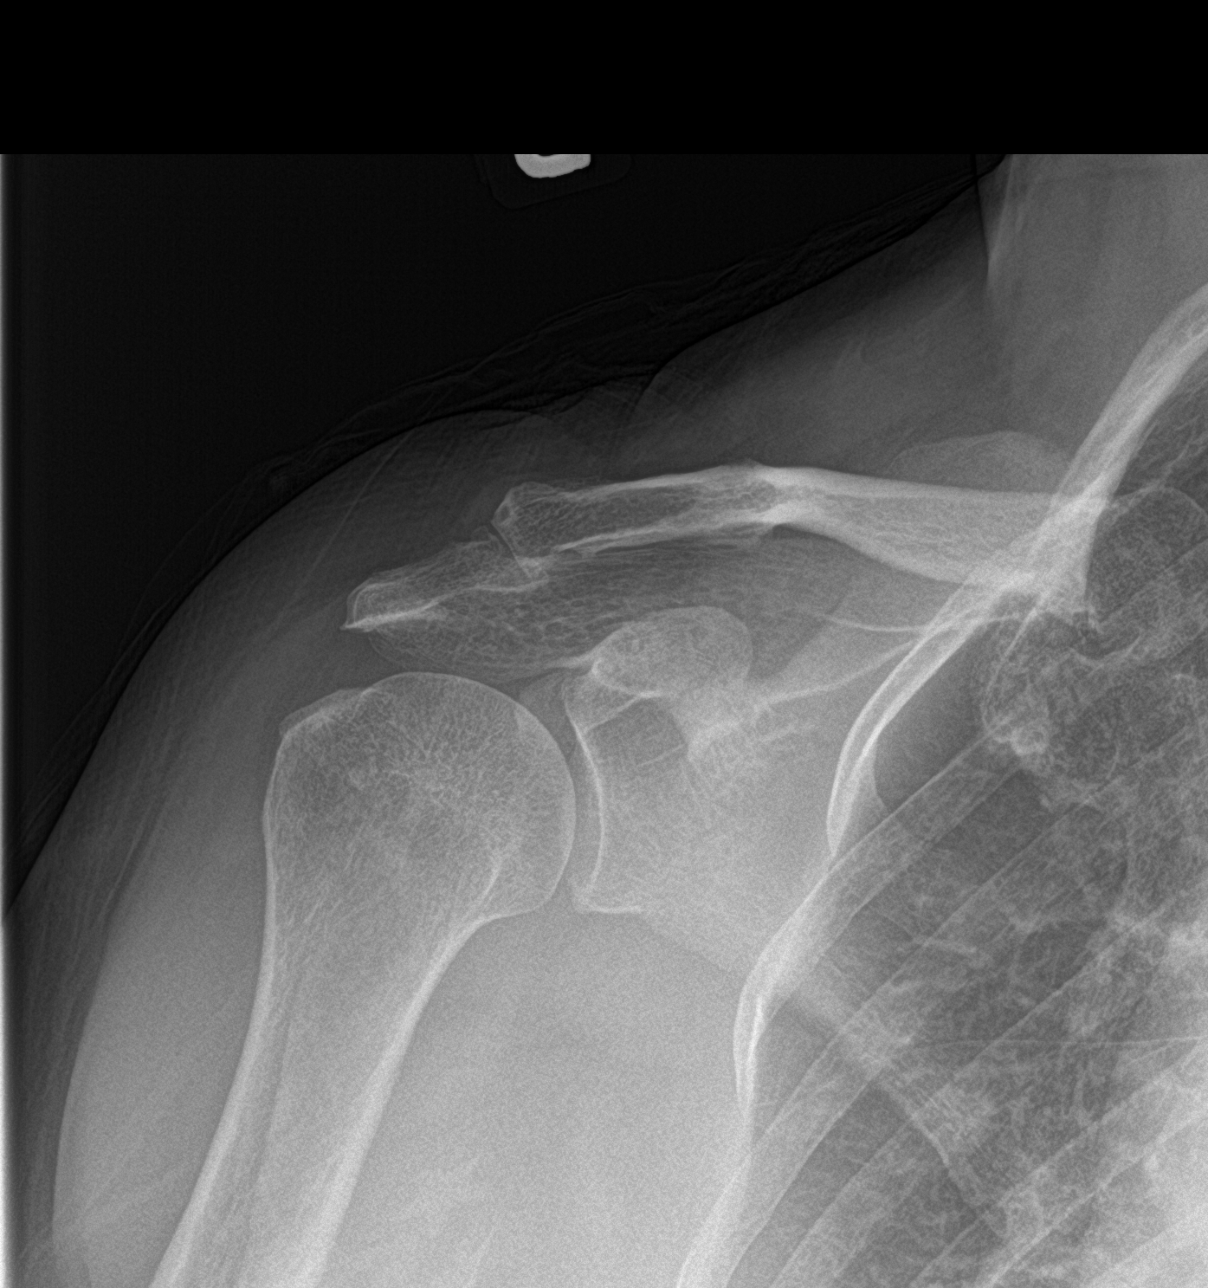
[im 2/3]
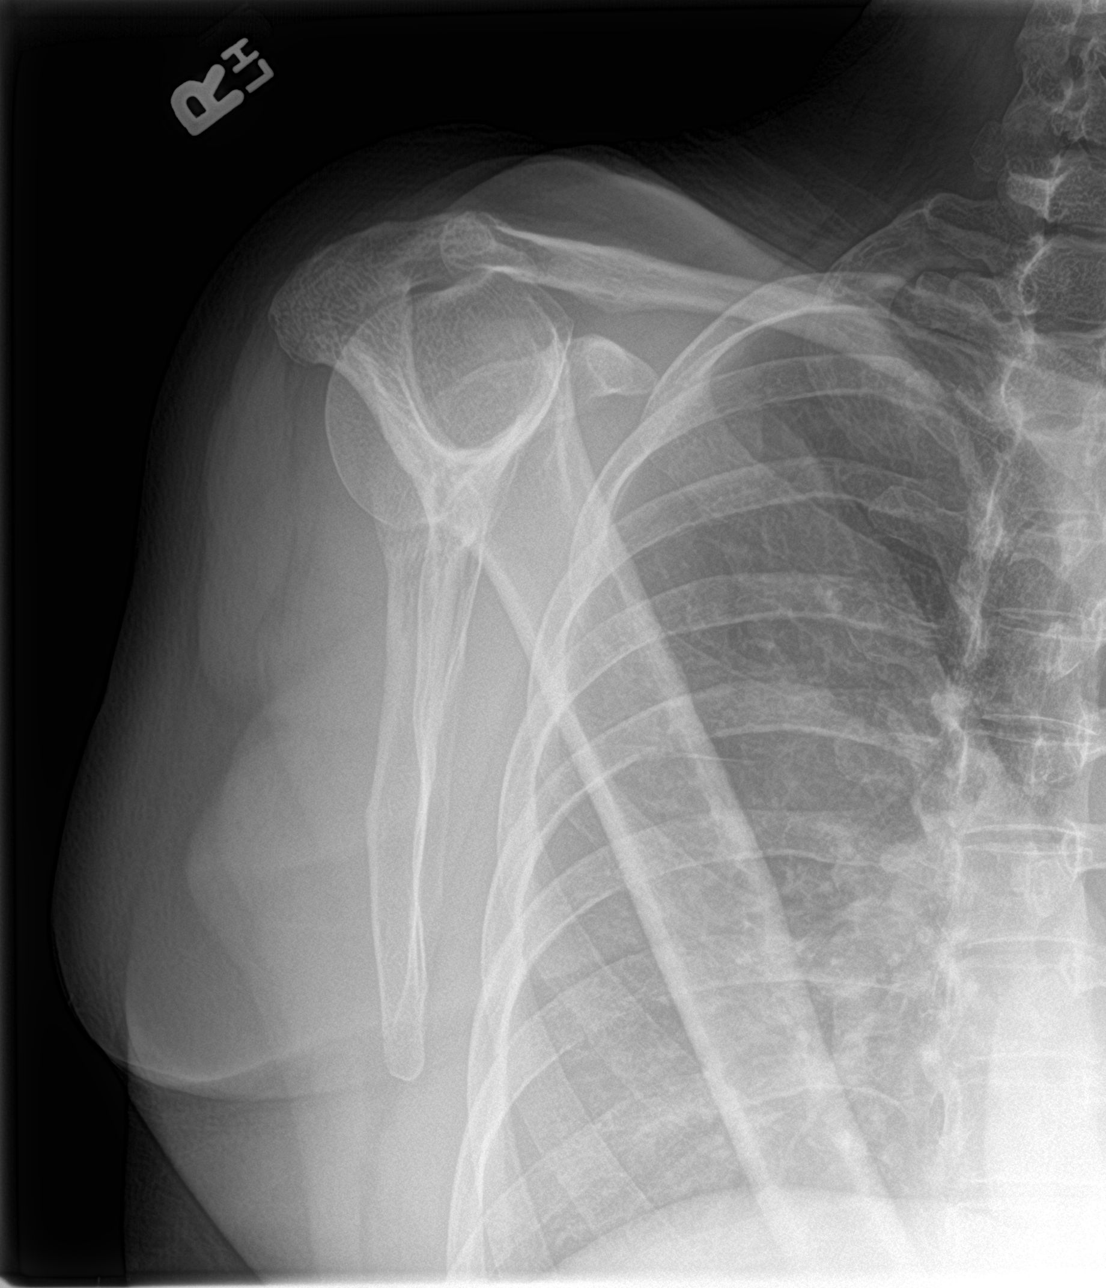
[im 3/3]
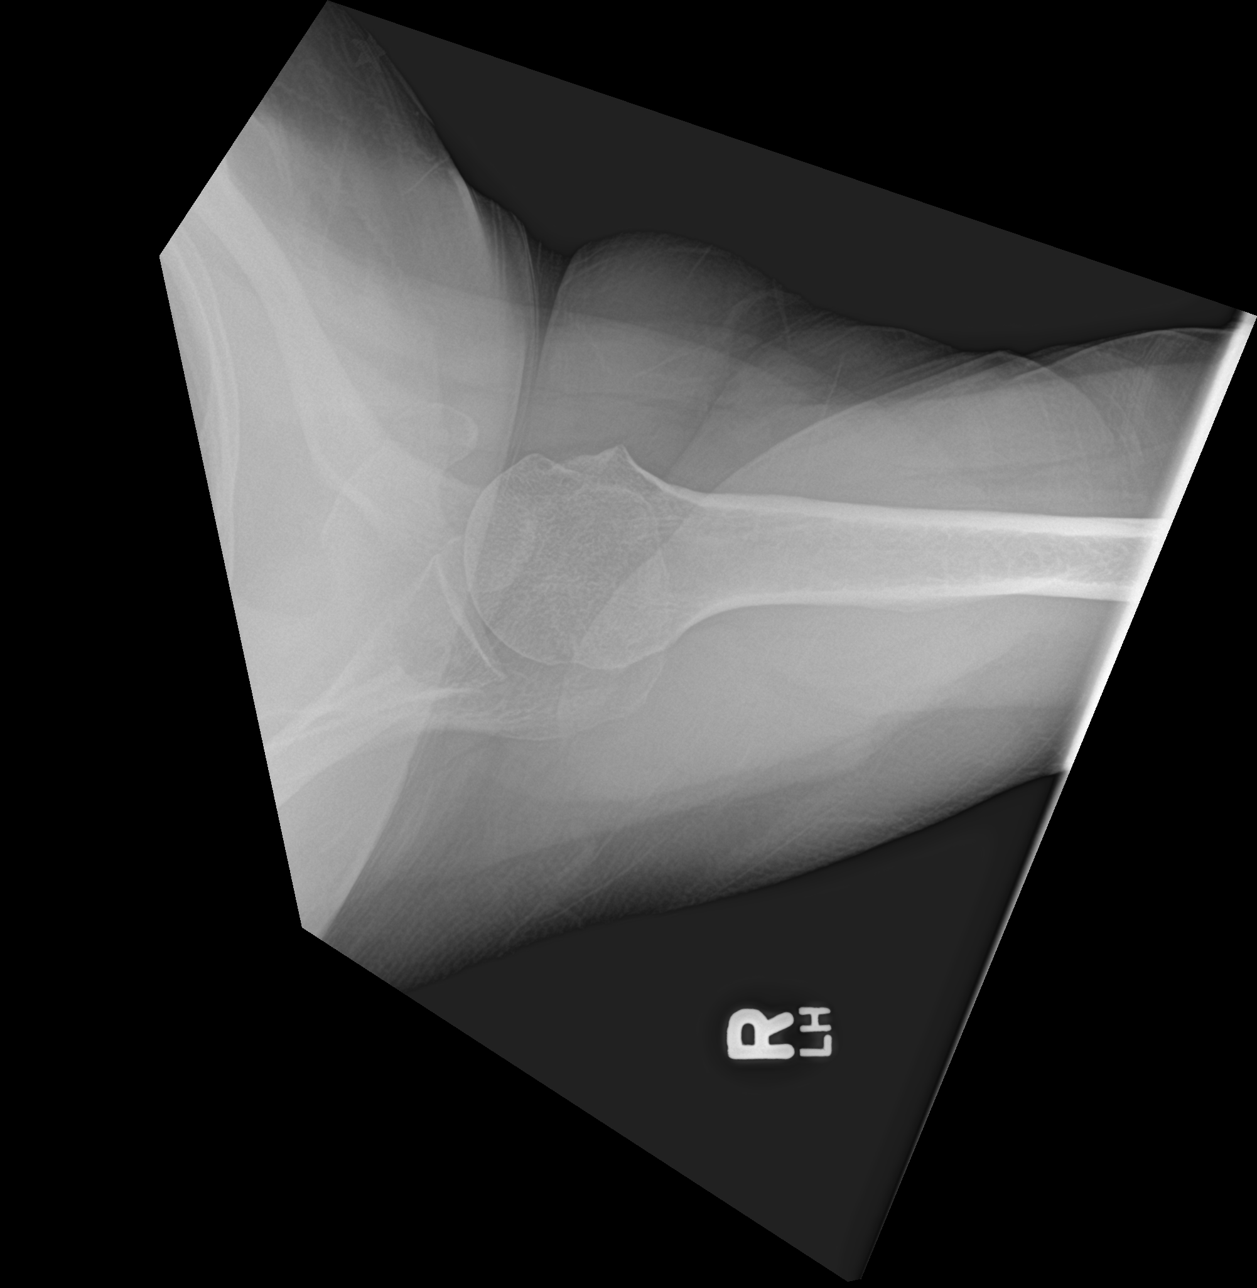

[3 of 3 positions shown; findings below may reference images not displayed]

FINDINGS: Moderate acromioclavicular joint space narrowing and mild peripheral
degenerative osteophytosis. Minimal inferior glenoid degenerative
osteophytosis. The glenohumeral joint space is maintained. No acute
fracture is seen. No dislocation. The visualized portion of the
right lung is unremarkable.
IMPRESSION: Mild-to-moderate acromioclavicular osteoarthritis, progressed from
prior.

## 2022-07-27 IMAGING — CR DG WRIST COMPLETE 3+V*R*
1 series · 4 of 4 positions shown · non-contrast
Comparison: None Available.

CLINICAL DATA: Pain.  Slipped and fell at work yesterday.

EXAM:
RIGHT WRIST - COMPLETE 3+ VIEW

[Series 1: dg wrist complete right · 0.14mm/px · 4 of 4 slices shown]
[im 1/4]
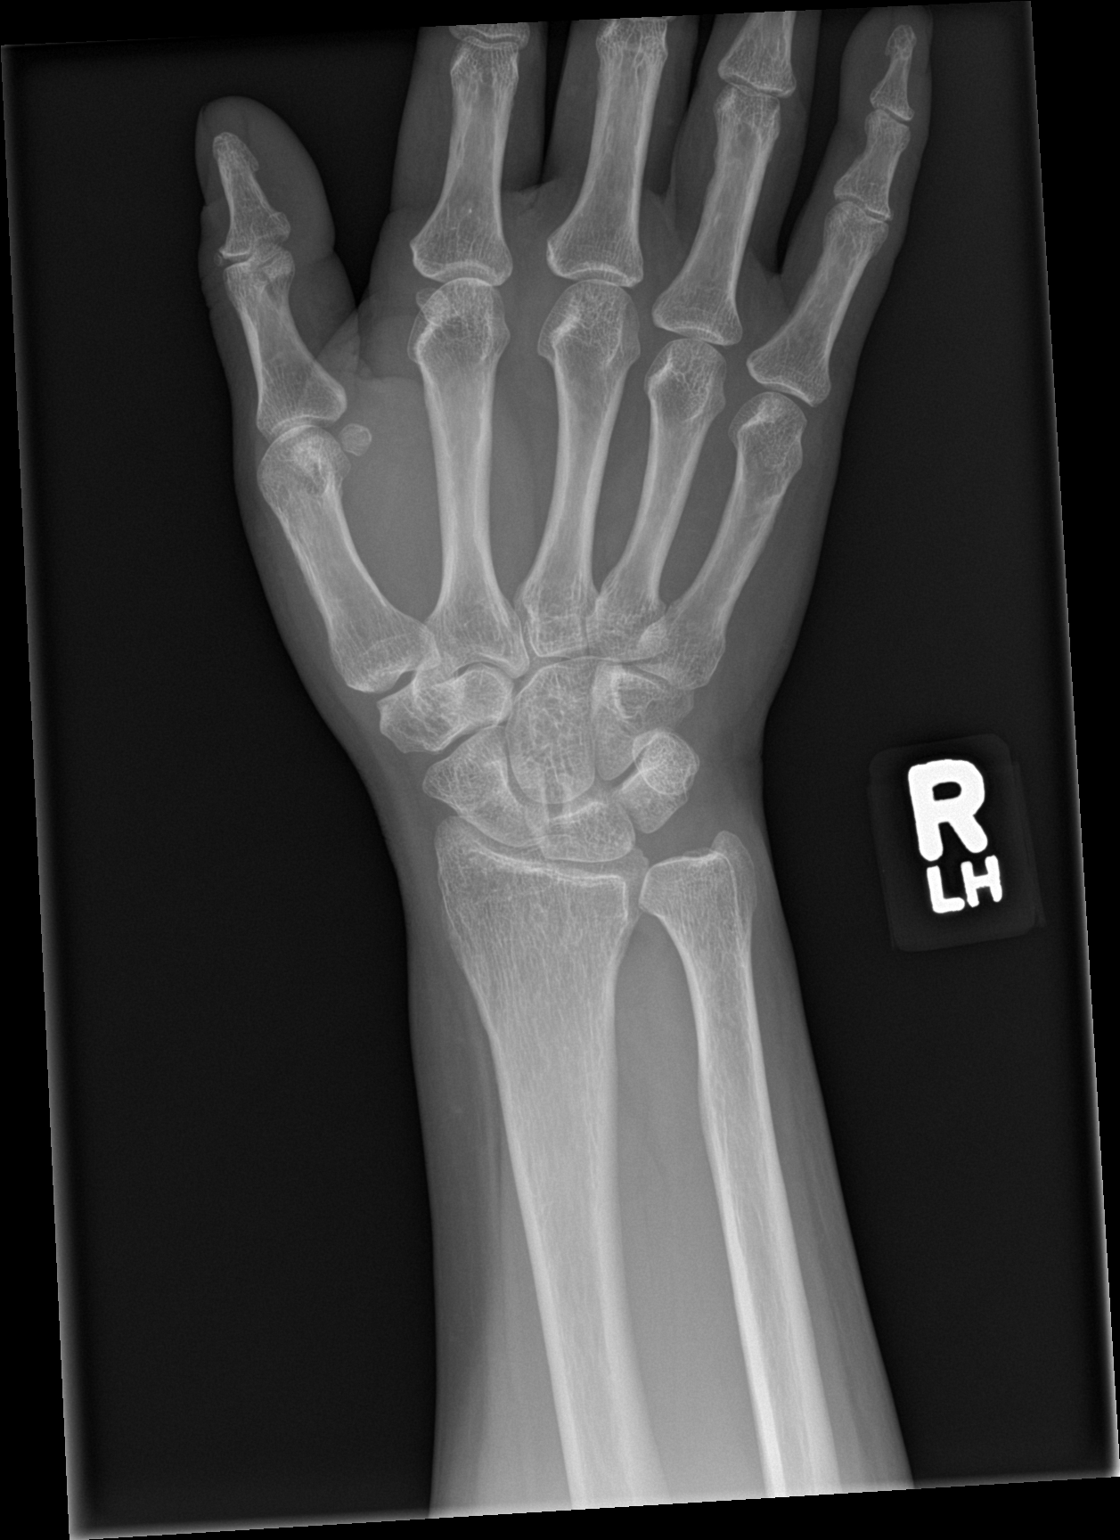
[im 2/4]
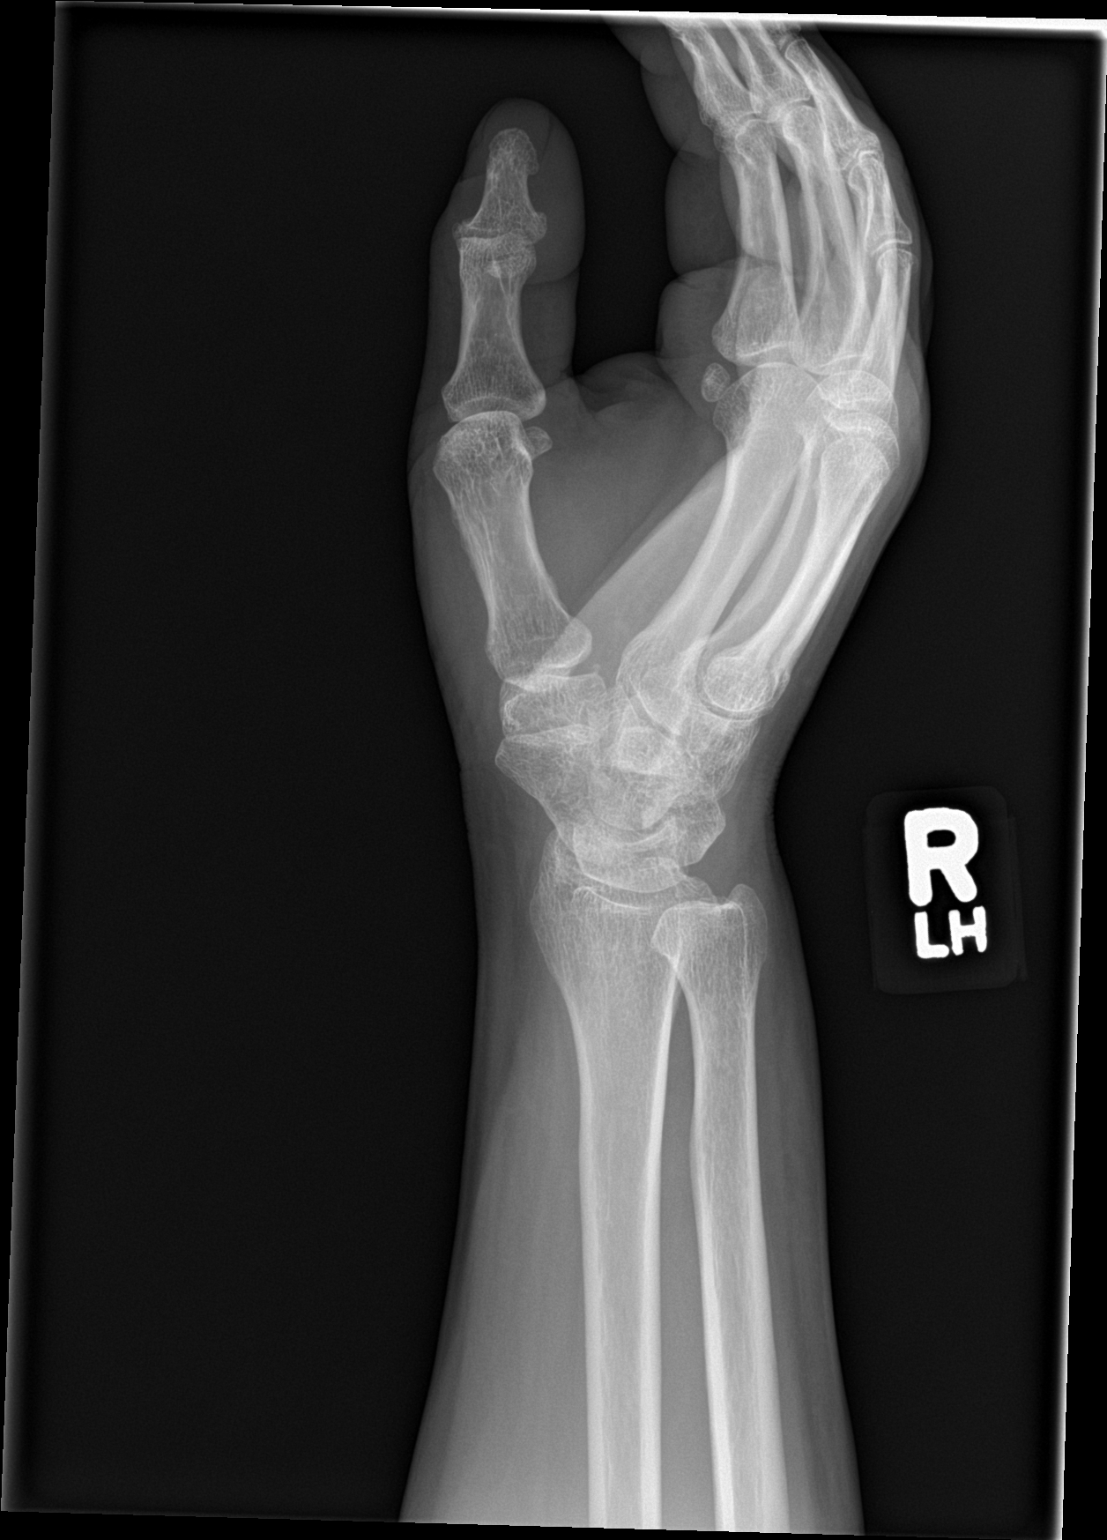
[im 3/4]
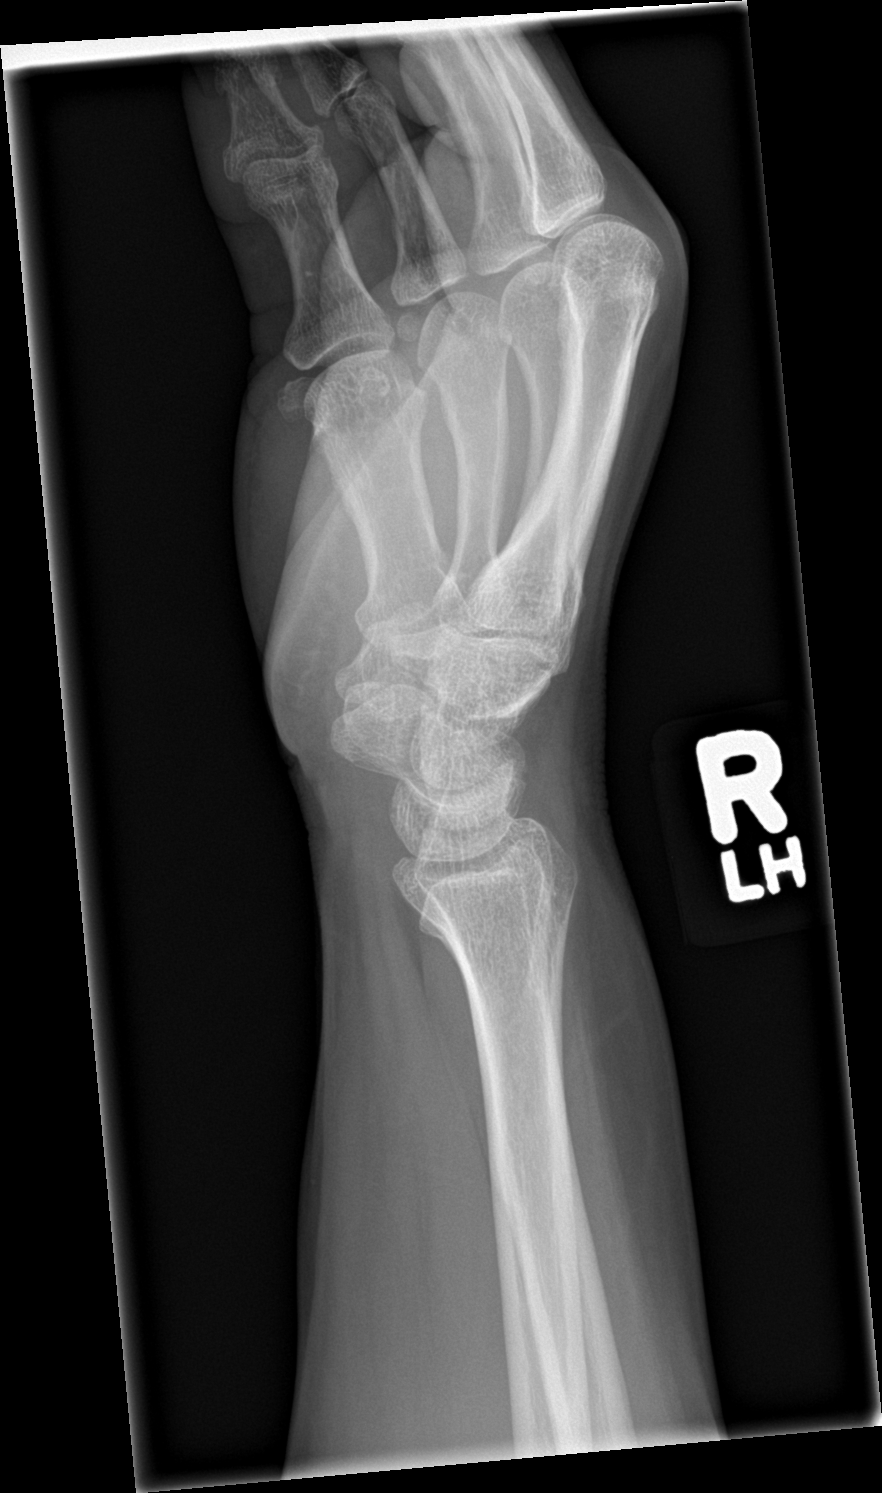
[im 4/4]
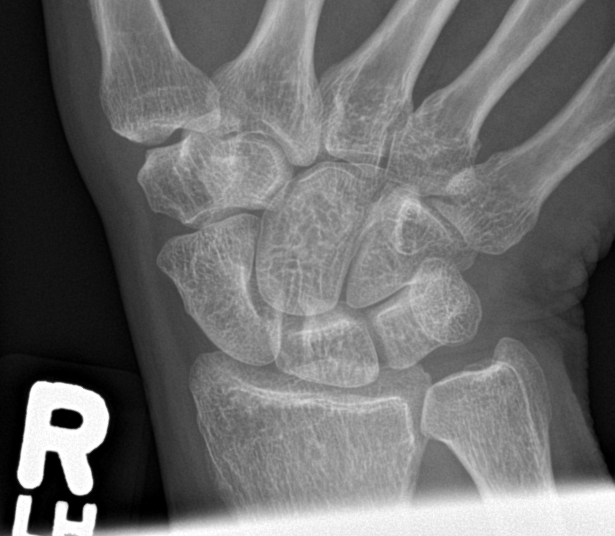

[4 of 4 positions shown; findings below may reference images not displayed]

FINDINGS: Mildly decreased bone mineralization. Mild triscaphe and thumb
carpometacarpal joint space narrowing. Mild radiocarpal joint space
narrowing. Mild degenerative ossicle at the dorsal aspect of the
thumb interphalangeal joint. No acute fracture is seen. No
dislocation. Minimal 1 mm ulnar positive variance.
IMPRESSION: Moderate triscaphe and thumb carpometacarpal osteoarthritis. No
acute fracture.

## 2022-07-27 IMAGING — CR DG HIP (WITH OR WITHOUT PELVIS) 2-3V*R*
1 series · 3 of 3 positions shown · non-contrast
Comparison: AP pelvis 03/19/2022

CLINICAL DATA: Slipped and fell at work.  Right hip pain.

EXAM:
DG HIP (WITH OR WITHOUT PELVIS) 2-3V RIGHT

[Series 1: dg hip unilat w or w/o pelvis 2-3 views  · non-contrast · 0.14mm/px · 3 of 3 slices shown]
[im 1/3]
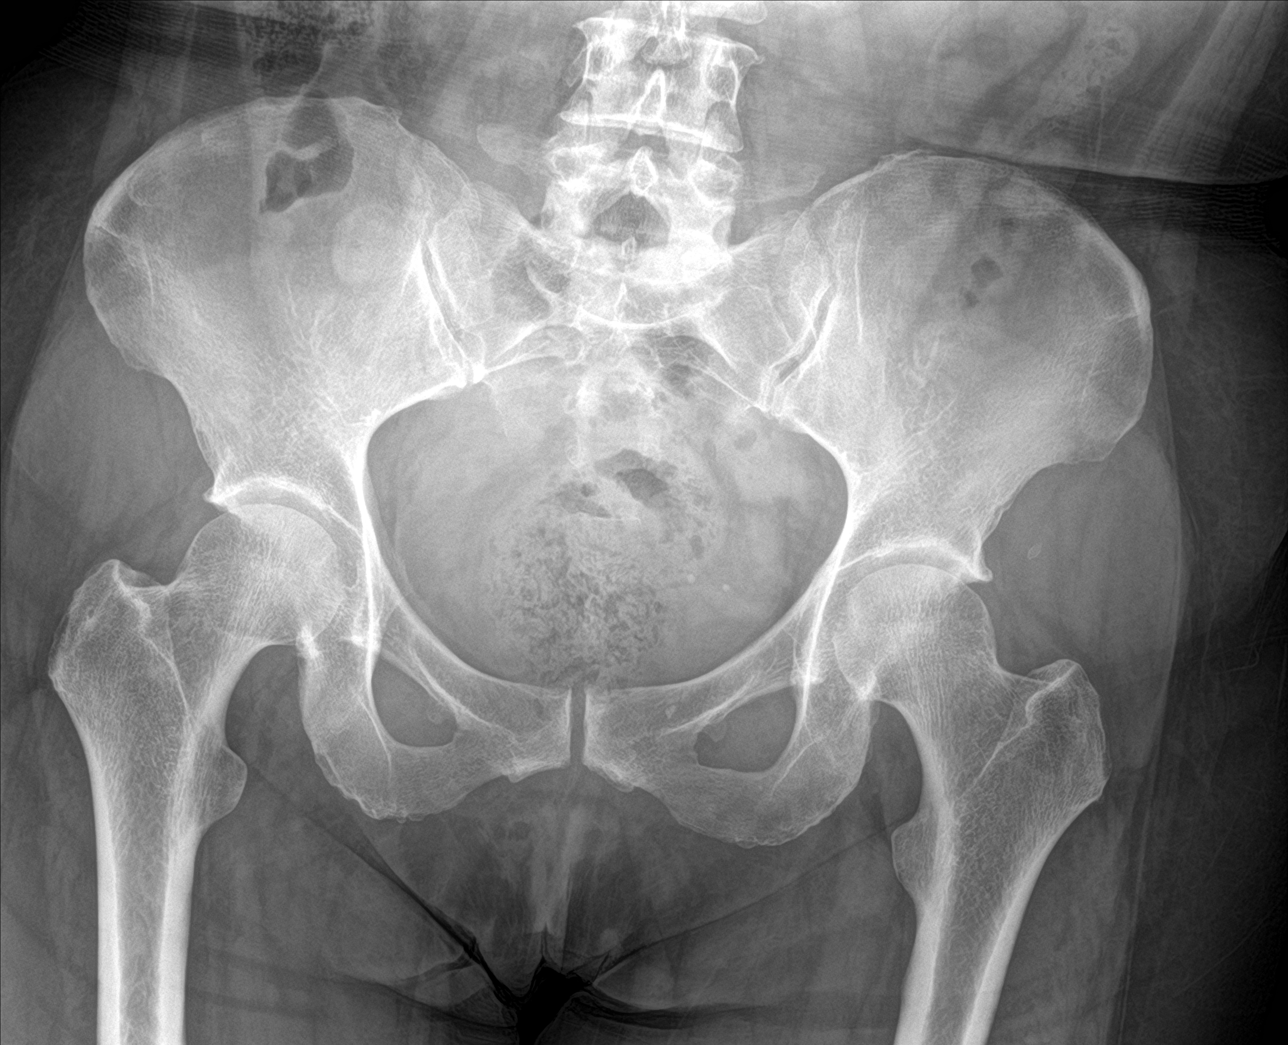
[im 2/3]
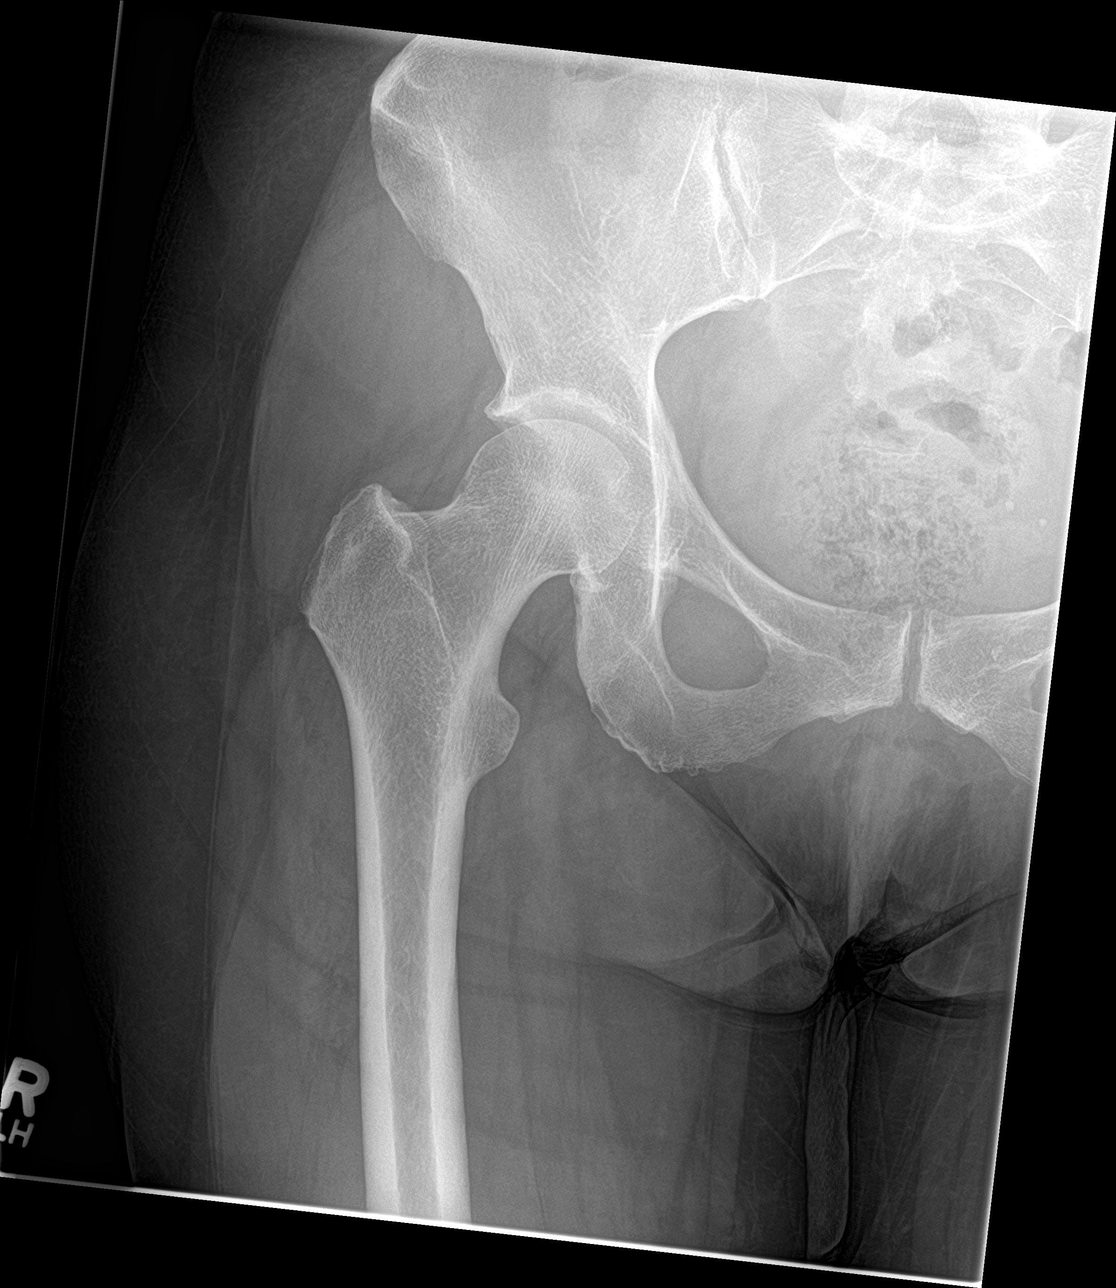
[im 3/3]
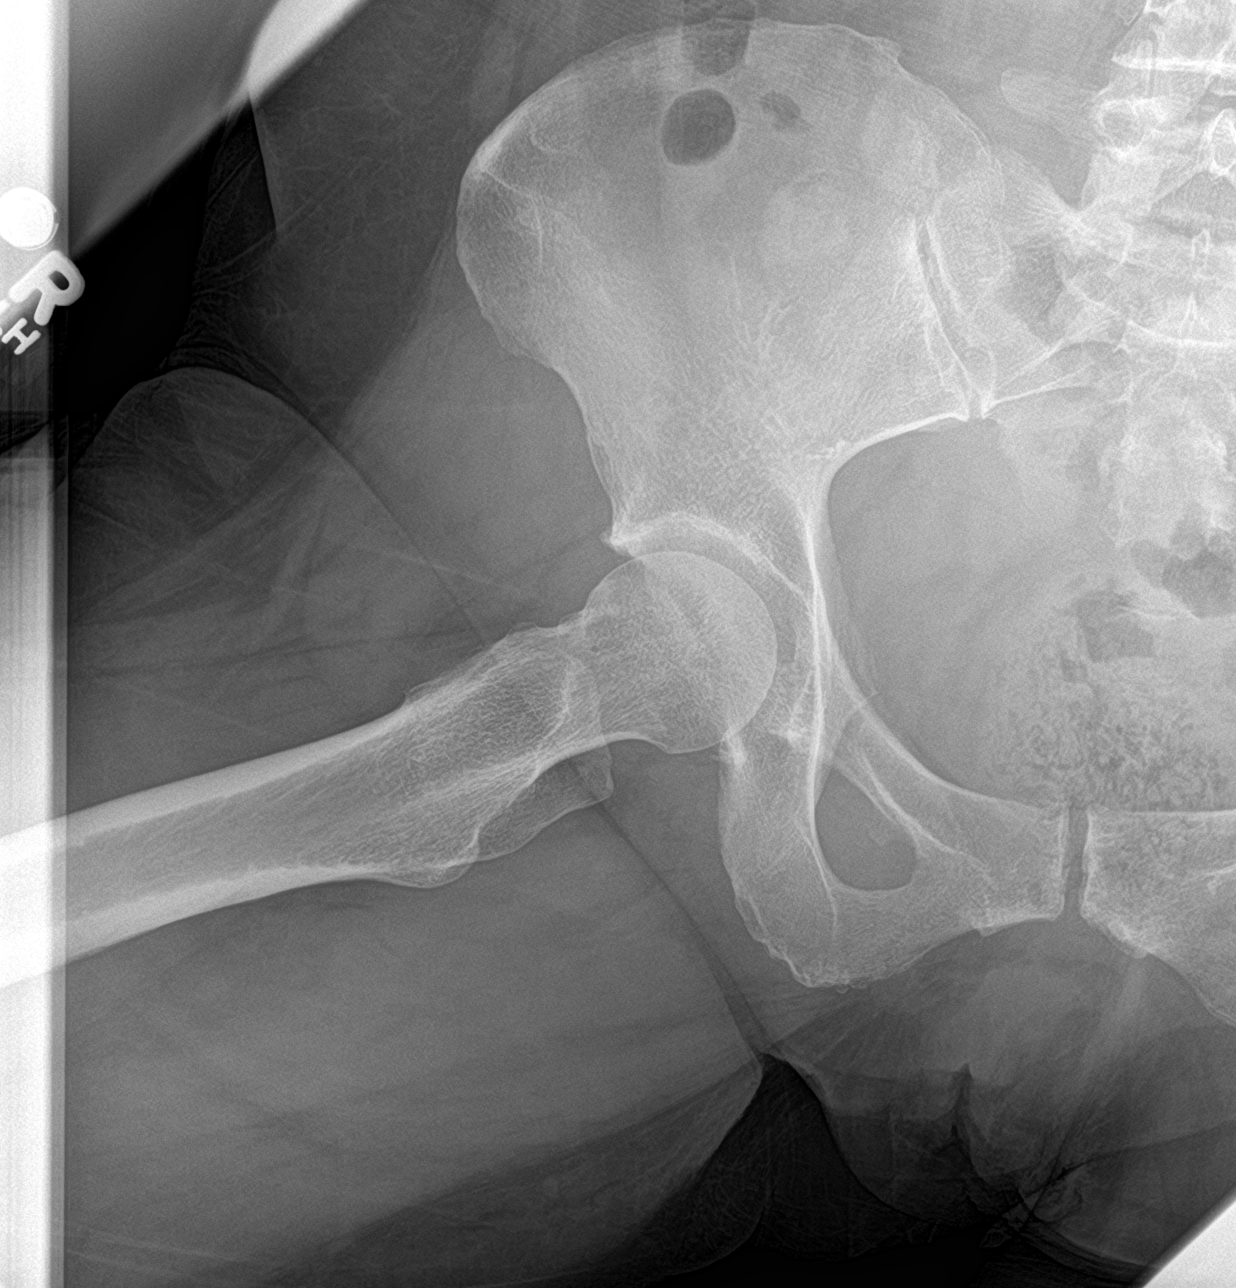

[3 of 3 positions shown; findings below may reference images not displayed]

FINDINGS: The bilateral sacroiliac common bilateral femoroacetabular and pubic
symphysis joint spaces are maintained. Mild left greater than right
superolateral acetabular degenerative osteophytes quadrant similar
to prior. No acute fracture or dislocation.
IMPRESSION: 1. Minimal left-greater-than-right femoroacetabular osteoarthritis.
2. No acute fracture.

## 2023-03-29 ENCOUNTER — Emergency Department: Payer: No Typology Code available for payment source

## 2023-03-29 ENCOUNTER — Emergency Department
Admission: EM | Admit: 2023-03-29 | Discharge: 2023-03-29 | Disposition: A | Discharge status: Home / Self Care | Payer: No Typology Code available for payment source | Attending: Emergency Medicine | Admitting: Emergency Medicine

## 2023-03-29 ENCOUNTER — Other Ambulatory Visit: Payer: Self-pay

## 2023-03-29 DIAGNOSIS — Y92410 Unspecified street and highway as the place of occurrence of the external cause: Secondary | ICD-10-CM | POA: Insufficient documentation

## 2023-03-29 DIAGNOSIS — M25511 Pain in right shoulder: Secondary | ICD-10-CM | POA: Diagnosis present

## 2023-03-29 DIAGNOSIS — S46911A Strain of unspecified muscle, fascia and tendon at shoulder and upper arm level, right arm, initial encounter: Secondary | ICD-10-CM | POA: Insufficient documentation

## 2023-03-29 MED ORDER — BACLOFEN 10 MG PO TABS: 10.0000 mg | ORAL_TABLET | Freq: Three times a day (TID) | ORAL | 0 refills | Status: AC

## 2023-03-29 MED ORDER — MELOXICAM 15 MG PO TABS: 15.0000 mg | ORAL_TABLET | Freq: Every day | ORAL | 2 refills | Status: AC

## 2023-03-29 MED ORDER — IBUPROFEN 600 MG PO TABS
600.0000 mg | ORAL_TABLET | Freq: Once | ORAL | Status: AC
  Administered 2023-03-29: 600 mg via ORAL
  Filled 2023-03-29: qty 1

## 2023-03-29 MED ORDER — BACLOFEN 10 MG PO TABS
10.0000 mg | ORAL_TABLET | Freq: Three times a day (TID) | ORAL | Status: DC
  Administered 2023-03-29: 10 mg via ORAL
  Filled 2023-03-29: qty 1
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
